# Patient Record
Sex: Male | Born: 2000 | Hispanic: No | Marital: Single | Smoking: Never smoker
Health system: Southern US, Community
[De-identification: ages and names within clinical notes are randomized; demographics above are authoritative.]

## PROBLEM LIST (undated history)

## (undated) HISTORY — PX: WRIST SURGERY: SHX841

---
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
## 2024-02-20 ENCOUNTER — Other Ambulatory Visit: Payer: Self-pay

## 2024-02-20 ENCOUNTER — Emergency Department
Admission: EM | Admit: 2024-02-20 | Discharge: 2024-02-20 | Disposition: A | Discharge status: Home / Self Care | Attending: Emergency Medicine | Admitting: Emergency Medicine

## 2024-02-20 DIAGNOSIS — R197 Diarrhea, unspecified: Secondary | ICD-10-CM | POA: Insufficient documentation

## 2024-02-20 DIAGNOSIS — K649 Unspecified hemorrhoids: Secondary | ICD-10-CM

## 2024-02-20 LAB — CBC
HCT: 43.9 % (ref 39.0–52.0)
Hemoglobin: 13.6 g/dL (ref 13.0–17.0)
MCH: 23.7 pg — ABNORMAL LOW (ref 26.0–34.0)
MCHC: 31 g/dL (ref 30.0–36.0)
MCV: 76.3 fL — ABNORMAL LOW (ref 80.0–100.0)
Platelets: 292 K/uL (ref 150–400)
RBC: 5.75 MIL/uL (ref 4.22–5.81)
RDW: 16.3 % — ABNORMAL HIGH (ref 11.5–15.5)
WBC: 6.5 K/uL (ref 4.0–10.5)
nRBC: 0 % (ref 0.0–0.2)

## 2024-02-20 LAB — COMPREHENSIVE METABOLIC PANEL WITH GFR
ALT: 26 U/L (ref 0–44)
AST: 31 U/L (ref 15–41)
Albumin: 3.8 g/dL (ref 3.5–5.0)
Alkaline Phosphatase: 68 U/L (ref 38–126)
Anion gap: 11 (ref 5–15)
BUN: 14 mg/dL (ref 6–20)
CO2: 24 mmol/L (ref 22–32)
Calcium: 9.3 mg/dL (ref 8.9–10.3)
Chloride: 105 mmol/L (ref 98–111)
Creatinine, Ser: 0.89 mg/dL (ref 0.61–1.24)
GFR, Estimated: >60 mL/min (ref >60)
Glucose, Bld: 113 mg/dL — ABNORMAL HIGH (ref 70–99)
Potassium: 3.5 mmol/L (ref 3.5–5.1)
Sodium: 140 mmol/L (ref 135–145)
Total Bilirubin: 1.5 mg/dL — ABNORMAL HIGH (ref 0.0–1.2)
Total Protein: 7.6 g/dL (ref 6.5–8.1)

## 2024-02-20 LAB — LIPASE, BLOOD: Lipase: 24 U/L (ref 11–51)

## 2024-02-20 NOTE — ED Provider Notes (Signed)
 [facility] Provider Note    Event Date/Time   First MD Initiated Contact with Patient 02/20/24 [id number]     (approximate)   History   Diarrhea   HPI  Steven Coffey is a 23 y.o. male who presents to the ED for evaluation of Diarrhea   Generally healthy 23 year old presents to the ED after an episode of diarrhea that occurred yesterday to make sure that he is okay.  Reports a single episode of diarrhea that occurred yesterday afternoon while he was at work.  Reports small-volume, liquid with little specks throughout his stool.  Reports brown in color with a couple drops of blood with it.  He reports a bump on his bottom and has questions about hemorrhoids.  He refuses to allow me to examine for hemorrhoids.  Reports he feels fine today but went to make sure he was okay after he got up from sleep this morning.  No fevers or emesis or surgical history to the abdomen   Physical Exam   Triage Vital Signs: ED Triage Vitals  Encounter Vitals Group     BP 02/20/24 0949 (!) 151/102     Girls Systolic BP Percentile --      Girls Diastolic BP Percentile --      Boys Systolic BP Percentile --      Boys Diastolic BP Percentile --      Pulse Rate 02/20/24 0948 65     Resp 02/20/24 0948 18     Temp 02/20/24 0948 98 F (36.7 C)     Temp src --      SpO2 02/20/24 0948 100 %     Weight 02/20/24 0951 190 lb (86.2 kg)     Height 02/20/24 0950 5' 5 (1.651 m)     Head Circumference --      Peak Flow --      Pain Score 02/20/24 0950 2     Pain Loc --      Pain Education --      Exclude from Growth Chart --     Most recent vital signs: Vitals:   02/20/24 0948 02/20/24 0949  BP:  (!) 151/102  Pulse: 65   Resp: 18   Temp: 98 F (36.7 C)   SpO2: 100%     General: Awake, no distress.  CV:  Good peripheral perfusion.  Resp:  Normal effort.  Abd:  No distention.  Soft and nontender throughout MSK:  No deformity noted.  Neuro:  No focal deficits  appreciated. Other:     ED Results / Procedures / Treatments   Labs (all labs ordered are listed, but only abnormal results are displayed) Labs Reviewed  LIPASE, BLOOD  COMPREHENSIVE METABOLIC PANEL WITH GFR  CBC  URINALYSIS, ROUTINE W REFLEX MICROSCOPIC    EKG   RADIOLOGY   Official radiology report(s): No results found.  PROCEDURES and INTERVENTIONS:  Procedures  Medications - No data to display   IMPRESSION / MDM / ASSESSMENT AND PLAN / ED COURSE  I reviewed the triage vital signs and the nursing notes.  Differential diagnosis includes, but is not limited to, hemorrhoids, gastroenteritis, acute appendicitis, diverticulitis, viral syndrome, foodborne illness  Patient presents after an episode of diarrhea that occurred yesterday without evidence of acute pathology and suitable for outpatient management.  Normal vitals and exam.  Normal hemoglobin and metabolic panel.  Suitable for outpatient management.  Provided GI follow-up information at his request.      FINAL CLINICAL IMPRESSION(S) /  ED DIAGNOSES   Final diagnoses:  None     Rx / DC Orders   ED Discharge Orders     None        Note:  This document was prepared using Dragon voice recognition software and may include unintentional dictation errors.   Claudene Rover, MD 02/20/24 [phone]

## 2024-02-20 NOTE — ED Triage Notes (Signed)
 Pt to ED for blood in stool, had diarrhea x1 yesterday. Reports this has happened in past before. +bloating.
